# Patient Record
Sex: Male | Born: 1995 | Race: Black or African American | Hispanic: No | Marital: Single | Smoking: Never smoker
Health system: Southern US, Community
[De-identification: ages and names within clinical notes are randomized; demographics above are authoritative.]

---
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
## 2015-06-08 ENCOUNTER — Ambulatory Visit: Payer: Self-pay | Admitting: Family Medicine

## 2015-07-07 ENCOUNTER — Ambulatory Visit (INDEPENDENT_AMBULATORY_CARE_PROVIDER_SITE_OTHER): Payer: Medicaid Other | Admitting: Family Medicine

## 2015-07-07 ENCOUNTER — Encounter: Payer: Self-pay | Admitting: Family Medicine

## 2015-07-07 VITALS — BP 118/72 | HR 79 | Temp 98.2°F | Resp 18 | Ht 67.0 in | Wt 123.6 lb

## 2015-07-07 DIAGNOSIS — R6252 Short stature (child): Secondary | ICD-10-CM

## 2015-07-07 DIAGNOSIS — Z0000 Encounter for general adult medical examination without abnormal findings: Secondary | ICD-10-CM | POA: Diagnosis not present

## 2015-07-07 NOTE — Progress Notes (Signed)
Name: Steven Schwartz   MRN: [id number]    DOB: 1996/07/26   Date:07/07/2015       Progress Note  Subjective  Chief Complaint  Chief Complaint  Patient presents with  . Establish Care    HPI  Steven Schwartz is a 19 year old male with no significant past medical history who is here today to establish care for primary care services. He reports no cigarette smoking, no excessive alcohol consumption, no illicit drug use. 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 He exercises regularly by playing basketball or football 2-3 x a week at least. He wonders if he has stopped growing in height. His parents are healthy with no medical issues he is aware of.   Social History  Substance Use Topics  . Smoking status: Never Smoker   . Smokeless tobacco: Not on file  . Alcohol Use: 0.0 oz/week    0 Standard drinks or equivalent per week     Comment: occasionally    No current outpatient prescriptions on file.  History reviewed. No pertinent past surgical history.  History reviewed. No pertinent family history.  No Known Allergies   Review of Systems  CONSTITUTIONAL: No significant weight changes, fever, chills, weakness or fatigue.  HEENT:  - Eyes: No visual changes.  - Ears: No auditory changes. No pain.  - Nose: No sneezing, congestion, runny nose. - Throat: No sore throat. No changes in swallowing. SKIN: No rash or itching.  CARDIOVASCULAR: No chest pain, chest pressure or chest discomfort. No palpitations or edema.  RESPIRATORY: No shortness of breath, cough or sputum.  GASTROINTESTINAL: No anorexia, nausea, vomiting. No changes in bowel habits. No abdominal pain or blood.  GENITOURINARY: No dysuria. No frequency. No discharge.  NEUROLOGICAL: No headache, dizziness, syncope, paralysis, ataxia, numbness or tingling in the extremities. No memory changes.  No change in bowel or bladder control.  MUSCULOSKELETAL: No joint pain. No muscle pain. HEMATOLOGIC: No anemia, bleeding or bruising.  LYMPHATICS: No enlarged lymph nodes.  PSYCHIATRIC: No change in mood. No change in sleep pattern.  ENDOCRINOLOGIC: No reports of sweating, cold or heat intolerance. No polyuria or polydipsia.     Objective  BP 118/72 mmHg  Pulse 79  Temp(Src) 98.2 F (36.8 C) (Oral)  Resp 18  Ht  (1.702 m)  Wt 123 lb 9.6 oz (56.065 kg)  BMI 19.35 kg/m2  SpO2 98% Body mass index is 19.35 kg/(m^2).  Physical Exam  Constitutional: Patient appears well-developed and well-nourished. In no distress.  HEENT:  - Head: Normocephalic and atraumatic.  - Ears: Bilateral TMs gray, no erythema or effusion - Nose: Nasal mucosa moist - Mouth/Throat: Oropharynx is clear and moist. No tonsillar hypertrophy or erythema. No post nasal drainage.  - Eyes: Conjunctivae clear, EOM movements normal. PERRLA. No scleral icterus.  Neck: Normal range of motion. Neck supple. No JVD present. No thyromegaly present.  Cardiovascular: Normal rate, regular rhythm and normal heart sounds.  No murmur heard.  Pulmonary/Chest: Effort normal and breath sounds normal. No respiratory distress. Musculoskeletal: Normal range of motion bilateral UE and LE, no joint effusions. Peripheral vascular: Bilateral LE no edema. Neurological: CN II-XII grossly intact with no focal deficits. Alert and oriented to person, place, and time. Coordination, balance, strength, speech and gait are normal.  Skin: Skin is warm and dry. No rash noted.  No erythema.  Psychiatric: Patient has a normal mood and affect. Behavior is normal in office today. Judgment and thought content normal in office today.   Assessment & Plan   1. Height below average Healthy male likely reached adult height. Declined blood work for now as he has no risk factors. Continue healthy habits and discussed need to STD screening annually.

## 2017-03-23 ENCOUNTER — Emergency Department
Admission: EM | Admit: 2017-03-23 | Discharge: 2017-03-23 | Disposition: A | Discharge status: Home / Self Care | Payer: Self-pay | Attending: Student in an Organized Health Care Education/Training Program | Admitting: Student in an Organized Health Care Education/Training Program

## 2017-03-23 ENCOUNTER — Encounter: Payer: Self-pay | Admitting: Emergency Medicine

## 2017-03-23 ENCOUNTER — Emergency Department: Payer: Self-pay

## 2017-03-23 DIAGNOSIS — S86011A Strain of right Achilles tendon, initial encounter: Secondary | ICD-10-CM | POA: Insufficient documentation

## 2017-03-23 DIAGNOSIS — Y9367 Activity, basketball: Secondary | ICD-10-CM | POA: Insufficient documentation

## 2017-03-23 DIAGNOSIS — Y999 Unspecified external cause status: Secondary | ICD-10-CM | POA: Insufficient documentation

## 2017-03-23 DIAGNOSIS — X58XXXA Exposure to other specified factors, initial encounter: Secondary | ICD-10-CM | POA: Insufficient documentation

## 2017-03-23 DIAGNOSIS — Y929 Unspecified place or not applicable: Secondary | ICD-10-CM | POA: Insufficient documentation

## 2017-03-23 MED ORDER — NAPROXEN 500 MG PO TABS: 500.0000 mg | ORAL_TABLET | Freq: Two times a day (BID) | ORAL | 0 refills | Status: AC

## 2017-03-23 MED ORDER — HYDROCODONE-ACETAMINOPHEN 5-325 MG PO TABS: 1.0000 | ORAL_TABLET | ORAL | 0 refills | Status: DC | PRN

## 2017-03-23 NOTE — ED Provider Notes (Signed)
[facility] Emergency Department Provider Note ____________________________________________  Time seen: Approximately 8:46 AM  I have reviewed the triage vital signs and the nursing notes.   HISTORY  Chief Complaint Ankle Pain    HPI Steven Schwartz is a 21 y.o. male who presents to the emergency department for evaluation of right ankle pain that started after playing basketball. He now has pain to the back of the right ankle and lower leg with some swelling. He has not taken anything for pain. He denies previous injury to the ankle.  History reviewed. No pertinent past medical history.   Patient Active Problem List   Diagnosis Date Noted  . Height below average 07/07/2015    History reviewed. No pertinent surgical history.  Prior to Admission medications   Medication Sig Start Date End Date Taking? Authorizing Provider  HYDROcodone-acetaminophen (NORCO/VICODIN) 5-325 MG tablet Take 1 tablet by mouth every 4 (four) hours as needed for moderate pain. 03/23/17 03/23/18  Ashland Osmer, Rulon Eisenmengerari B, FNP  naproxen (NAPROSYN) 500 MG tablet Take 1 tablet (500 mg total) by mouth 2 (two) times daily with a meal. 03/23/17   Norita Meigs B, FNP    Allergies Patient has no known allergies.  No family history on file.  Social History Social History  Substance Use Topics  . Smoking status: Never Smoker  . Smokeless tobacco: Never Used  . Alcohol use 0.0 oz/week     Comment: occasionally    Review of Systems Constitutional: Well appearing. Cardiovascular: Negative for active bleeding  Respiratory: Negative for shortness of breath Musculoskeletal: Positive for right foot and ankle pain. Skin: Negative for lesion or wound. Neurological: Negative for loss of sensation or motor function  ____________________________________________   PHYSICAL EXAM:  VITAL SIGNS: ED Triage Vitals [03/23/17 0837]  Enc Vitals Group     BP 117/84     Pulse Rate 77     Resp 20   Temp 98.3 F (36.8 C)     Temp Source Oral     SpO2 100 %     Weight 123 lb (55.8 kg)     Height      Head Circumference      Peak Flow      Pain Score 10     Pain Loc      Pain Edu?      Excl. in GC?     Constitutional: Alert and oriented. Well appearing and in no acute distress. Eyes: Conjunctiva are clear without drainage  Head: Atraumatic Neck: Full ROM, Nexus criteria is negative Respiratory: Respirations are even and unlabored. Musculoskeletal: Limited ROM of the right ankle due to pain. Thompson's test is positive.  Neurologic: +2 point discrimination intact.  Skin: No lesion or wound  Psychiatric: Normal affect and behavior.  ____________________________________________   LABS (all labs ordered are listed, but only abnormal results are displayed)  Labs Reviewed - No data to display ____________________________________________  RADIOLOGY  Right ankle negative for acute bony abnormality. Thickening in the region of the Achilles which may be related to a partial tear.  ____________________________________________   PROCEDURES  Procedure(s) performed: None  ____________________________________________   INITIAL IMPRESSION / ASSESSMENT AND PLAN / ED COURSE  Steven Schwartz is a 21 y.o. male who presents to the emergency department for evaluation of right ankle and foot pain. Pain started after jumping during basketball game. X-ray and exam consistent with Achilles Tendon rupture. He was placed in an ankle stirrup splint and advised to leave it in  place until follow up with orthopedics. He was advised to return to the ER for symptoms that change or worsen if unable to schedule an appointment.  Pertinent labs & imaging results that were available during my care of the patient were reviewed by me and considered in my medical decision making (see chart for details).  _________________________________________   FINAL CLINICAL IMPRESSION(S) / ED DIAGNOSES  Final  diagnoses:  Achilles tendon tear, right, initial encounter    Discharge Medication List as of 03/23/2017 11:10 AM    START taking these medications   Details  HYDROcodone-acetaminophen (NORCO/VICODIN) 5-325 MG tablet Take 1 tablet by mouth every 4 (four) hours as needed for moderate pain., Starting Thu 03/23/2017, Until Fri 03/23/2018, Print    naproxen (NAPROSYN) 500 MG tablet Take 1 tablet (500 mg total) by mouth 2 (two) times daily with a meal., Starting Thu 03/23/2017, Print        If controlled substance prescribed during this visit, 12 month history viewed on the [facility] prior to issuing an initial prescription for Schedule II or III opiod.    Chinita Pester, FNP 03/27/17 1505    Willy Eddy, MD 03/27/17 [phone]

## 2017-03-23 NOTE — ED Notes (Signed)
Pt reports hurting ankle playing basketball yesterday. Reports pain to back of right ankle and lower leg. Minimal swelling noted. Good pedal pulses.

## 2017-03-23 NOTE — Discharge Instructions (Signed)
Wear the ankle stirrup splint until you are evaluated by orthopedics. Use your crutches.

## 2017-03-23 NOTE — ED Triage Notes (Signed)
Pt with right ankle pain.  

## 2017-03-30 ENCOUNTER — Encounter: Payer: Self-pay | Admitting: *Deleted

## 2017-03-30 ENCOUNTER — Encounter
Admission: RE | Disposition: A | Discharge status: Home / Self Care | Payer: Self-pay | Source: Ambulatory Visit | Attending: Surgery

## 2017-03-30 ENCOUNTER — Ambulatory Visit: Payer: Self-pay | Admitting: Anesthesiology

## 2017-03-30 ENCOUNTER — Ambulatory Visit
Admission: RE | Admit: 2017-03-30 | Discharge: 2017-03-30 | Disposition: A | Discharge status: Home / Self Care | Payer: Self-pay | Source: Ambulatory Visit | Attending: Surgery | Admitting: Surgery

## 2017-03-30 DIAGNOSIS — S86012A Strain of left Achilles tendon, initial encounter: Secondary | ICD-10-CM | POA: Insufficient documentation

## 2017-03-30 DIAGNOSIS — Z8249 Family history of ischemic heart disease and other diseases of the circulatory system: Secondary | ICD-10-CM | POA: Insufficient documentation

## 2017-03-30 DIAGNOSIS — Y929 Unspecified place or not applicable: Secondary | ICD-10-CM | POA: Insufficient documentation

## 2017-03-30 DIAGNOSIS — Y9367 Activity, basketball: Secondary | ICD-10-CM | POA: Insufficient documentation

## 2017-03-30 HISTORY — PX: ACHILLES TENDON SURGERY: SHX542

## 2017-03-30 LAB — URINE DRUG SCREEN, QUALITATIVE (ARMC ONLY)
AMPHETAMINES, UR SCREEN: NOT DETECTED
BENZODIAZEPINE, UR SCRN: NOT DETECTED
Barbiturates, Ur Screen: NOT DETECTED
COCAINE METABOLITE, UR ~~LOC~~: NOT DETECTED
Cannabinoid 50 Ng, Ur ~~LOC~~: POSITIVE — AB
MDMA (Ecstasy)Ur Screen: NOT DETECTED
METHADONE SCREEN, URINE: NOT DETECTED
OPIATE, UR SCREEN: POSITIVE — AB
Phencyclidine (PCP) Ur S: NOT DETECTED
Tricyclic, Ur Screen: NOT DETECTED

## 2017-03-30 SURGERY — REPAIR, TENDON, ACHILLES
Anesthesia: General | Laterality: Right | Wound class: Clean

## 2017-03-30 MED ORDER — SUGAMMADEX SODIUM 200 MG/2ML IV SOLN
INTRAVENOUS | Status: DC | PRN
  Administered 2017-03-30: 200 mg via INTRAVENOUS

## 2017-03-30 MED ORDER — MIDAZOLAM HCL 2 MG/2ML IJ SOLN
INTRAMUSCULAR | Status: AC
  Filled 2017-03-30: qty 2

## 2017-03-30 MED ORDER — FENTANYL CITRATE (PF) 100 MCG/2ML IJ SOLN
25.0000 ug | INTRAMUSCULAR | Status: DC | PRN
Start: 2017-03-30 — End: 2017-03-30

## 2017-03-30 MED ORDER — ONDANSETRON HCL 4 MG/2ML IJ SOLN: 4.0000 mg | Freq: Once | INTRAMUSCULAR | Status: DC | PRN

## 2017-03-30 MED ORDER — MIDAZOLAM HCL 2 MG/2ML IJ SOLN
2.0000 mg | Freq: Once | INTRAMUSCULAR | Status: AC
  Administered 2017-03-30: 2 mg via INTRAVENOUS

## 2017-03-30 MED ORDER — FENTANYL CITRATE (PF) 100 MCG/2ML IJ SOLN
50.0000 ug | Freq: Once | INTRAMUSCULAR | Status: AC
  Administered 2017-03-30: 50 ug via INTRAVENOUS

## 2017-03-30 MED ORDER — LIDOCAINE HCL (PF) 1 % IJ SOLN
INTRAMUSCULAR | Status: DC | PRN
  Administered 2017-03-30: 3 mL

## 2017-03-30 MED ORDER — ONDANSETRON HCL 4 MG/2ML IJ SOLN
INTRAMUSCULAR | Status: AC
  Filled 2017-03-30: qty 2

## 2017-03-30 MED ORDER — PROPOFOL 10 MG/ML IV BOLUS
INTRAVENOUS | Status: DC | PRN
  Administered 2017-03-30: 20 mg via INTRAVENOUS
  Administered 2017-03-30: 120 mg via INTRAVENOUS
  Administered 2017-03-30: 50 mg via INTRAVENOUS

## 2017-03-30 MED ORDER — ONDANSETRON HCL 4 MG/2ML IJ SOLN
INTRAMUSCULAR | Status: DC | PRN
  Administered 2017-03-30: 4 mg via INTRAVENOUS

## 2017-03-30 MED ORDER — BUPIVACAINE HCL (PF) 0.5 % IJ SOLN
INTRAMUSCULAR | Status: AC
  Filled 2017-03-30: qty 30

## 2017-03-30 MED ORDER — FENTANYL CITRATE (PF) 100 MCG/2ML IJ SOLN
INTRAMUSCULAR | Status: AC
  Filled 2017-03-30: qty 2

## 2017-03-30 MED ORDER — ACETAMINOPHEN 10 MG/ML IV SOLN
INTRAVENOUS | Status: DC | PRN
  Administered 2017-03-30: 1000 mg via INTRAVENOUS

## 2017-03-30 MED ORDER — ONDANSETRON HCL 4 MG PO TABS: 4.0000 mg | ORAL_TABLET | Freq: Four times a day (QID) | ORAL | Status: DC | PRN

## 2017-03-30 MED ORDER — ROPIVACAINE HCL 5 MG/ML IJ SOLN
INTRAMUSCULAR | Status: AC
  Filled 2017-03-30: qty 30

## 2017-03-30 MED ORDER — ROCURONIUM BROMIDE 100 MG/10ML IV SOLN
INTRAVENOUS | Status: DC | PRN
  Administered 2017-03-30: 50 mg via INTRAVENOUS

## 2017-03-30 MED ORDER — PROPOFOL 10 MG/ML IV BOLUS
INTRAVENOUS | Status: AC
  Filled 2017-03-30: qty 20

## 2017-03-30 MED ORDER — SUGAMMADEX SODIUM 200 MG/2ML IV SOLN
INTRAVENOUS | Status: AC
  Filled 2017-03-30: qty 2

## 2017-03-30 MED ORDER — PHENYLEPHRINE HCL 10 MG/ML IJ SOLN
INTRAMUSCULAR | Status: AC
  Filled 2017-03-30: qty 1

## 2017-03-30 MED ORDER — NEOMYCIN-POLYMYXIN B GU 40-200000 IR SOLN
Status: AC
  Filled 2017-03-30: qty 4

## 2017-03-30 MED ORDER — ACETAMINOPHEN 10 MG/ML IV SOLN
INTRAVENOUS | Status: AC
  Filled 2017-03-30: qty 100

## 2017-03-30 MED ORDER — BUPIVACAINE HCL 0.5 % IJ SOLN
INTRAMUSCULAR | Status: DC | PRN
  Administered 2017-03-30: 20 mL

## 2017-03-30 MED ORDER — NEOMYCIN-POLYMYXIN B GU 40-200000 IR SOLN
Status: AC
  Filled 2017-03-30: qty 2

## 2017-03-30 MED ORDER — LIDOCAINE HCL (PF) 1 % IJ SOLN
INTRAMUSCULAR | Status: AC
  Filled 2017-03-30: qty 5

## 2017-03-30 MED ORDER — PHENYLEPHRINE HCL 10 MG/ML IJ SOLN
INTRAMUSCULAR | Status: DC | PRN
  Administered 2017-03-30 (×5): 100 ug via INTRAVENOUS

## 2017-03-30 MED ORDER — NEOMYCIN-POLYMYXIN B GU 40-200000 IR SOLN
Status: DC | PRN
  Administered 2017-03-30: 2 mL

## 2017-03-30 MED ORDER — OXYCODONE HCL 5 MG PO TABS: 5.0000 mg | ORAL_TABLET | ORAL | Status: DC | PRN

## 2017-03-30 MED ORDER — METOCLOPRAMIDE HCL 5 MG/ML IJ SOLN: 5.0000 mg | Freq: Three times a day (TID) | INTRAMUSCULAR | Status: DC | PRN

## 2017-03-30 MED ORDER — LACTATED RINGERS IV SOLN
INTRAVENOUS | Status: DC
  Administered 2017-03-30: 12:00:00 via INTRAVENOUS

## 2017-03-30 MED ORDER — POTASSIUM CHLORIDE IN NACL 20-0.9 MEQ/L-% IV SOLN: INTRAVENOUS | Status: DC

## 2017-03-30 MED ORDER — METOCLOPRAMIDE HCL 10 MG PO TABS: 5.0000 mg | ORAL_TABLET | Freq: Three times a day (TID) | ORAL | Status: DC | PRN

## 2017-03-30 MED ORDER — CEFAZOLIN SODIUM-DEXTROSE 2-4 GM/100ML-% IV SOLN
2.0000 g | Freq: Once | INTRAVENOUS | Status: AC
  Administered 2017-03-30: 2 g via INTRAVENOUS

## 2017-03-30 MED ORDER — ONDANSETRON HCL 4 MG/2ML IJ SOLN: 4.0000 mg | Freq: Four times a day (QID) | INTRAMUSCULAR | Status: DC | PRN

## 2017-03-30 MED ORDER — LACTATED RINGERS IV SOLN
INTRAVENOUS | Status: DC | PRN
  Administered 2017-03-30: 13:00:00 via INTRAVENOUS

## 2017-03-30 MED ORDER — ROCURONIUM BROMIDE 50 MG/5ML IV SOLN
INTRAVENOUS | Status: AC
  Filled 2017-03-30: qty 1

## 2017-03-30 MED ORDER — CEFAZOLIN SODIUM-DEXTROSE 2-4 GM/100ML-% IV SOLN
INTRAVENOUS | Status: AC
  Filled 2017-03-30: qty 100

## 2017-03-30 MED ORDER — LIDOCAINE HCL (CARDIAC) 20 MG/ML IV SOLN
INTRAVENOUS | Status: DC | PRN
  Administered 2017-03-30: 40 mg via INTRAVENOUS

## 2017-03-30 MED ORDER — ROPIVACAINE HCL 5 MG/ML IJ SOLN
INTRAMUSCULAR | Status: DC | PRN
  Administered 2017-03-30: 30 mL via PERINEURAL

## 2017-03-30 MED ORDER — FENTANYL CITRATE (PF) 100 MCG/2ML IJ SOLN
INTRAMUSCULAR | Status: DC | PRN
  Administered 2017-03-30: 50 ug via INTRAVENOUS

## 2017-03-30 MED ORDER — OXYCODONE HCL 5 MG PO TABS: 5.0000 mg | ORAL_TABLET | ORAL | 0 refills | Status: AC | PRN

## 2017-03-30 SURGICAL SUPPLY — 41 items
BANDAGE ACE 4X5 VEL STRL LF (GAUZE/BANDAGES/DRESSINGS) ×6 IMPLANT
BANDAGE ACE 6X5 VEL STRL LF (GAUZE/BANDAGES/DRESSINGS) ×3 IMPLANT
BNDG ESMARK 6X12 TAN STRL LF (GAUZE/BANDAGES/DRESSINGS) ×3 IMPLANT
BNDG PLASTER FAST 4X5 WHT LF (CAST SUPPLIES) ×12 IMPLANT
CANISTER SUCT 1200ML W/VALVE (MISCELLANEOUS) ×3 IMPLANT
CASTING MATERIAL DELTA LITE (CAST SUPPLIES) ×3 IMPLANT
CHLORAPREP W/TINT 26ML (MISCELLANEOUS) ×6 IMPLANT
CUFF TOURN 24 STER (MISCELLANEOUS) ×3 IMPLANT
DRAPE INCISE IOBAN 66X45 STRL (DRAPES) ×3 IMPLANT
DRAPE U-SHAPE 47X51 STRL (DRAPES) ×3 IMPLANT
ELECT CAUTERY BLADE 6.4 (BLADE) ×3 IMPLANT
ELECT REM PT RETURN 9FT ADLT (ELECTROSURGICAL) ×3
ELECTRODE REM PT RTRN 9FT ADLT (ELECTROSURGICAL) ×1 IMPLANT
GAUZE PETRO XEROFOAM 1X8 (MISCELLANEOUS) ×3 IMPLANT
GAUZE SPONGE 4X4 12PLY STRL (GAUZE/BANDAGES/DRESSINGS) ×3 IMPLANT
GLOVE BIO SURGEON STRL SZ8 (GLOVE) ×6 IMPLANT
GLOVE INDICATOR 8.0 STRL GRN (GLOVE) ×3 IMPLANT
GOWN STRL REUS W/ TWL LRG LVL3 (GOWN DISPOSABLE) ×1 IMPLANT
GOWN STRL REUS W/ TWL XL LVL3 (GOWN DISPOSABLE) ×1 IMPLANT
GOWN STRL REUS W/TWL LRG LVL3 (GOWN DISPOSABLE) ×2
GOWN STRL REUS W/TWL XL LVL3 (GOWN DISPOSABLE) ×2
KIT RM TURNOVER STRD PROC AR (KITS) ×3 IMPLANT
NS IRRIG 1000ML POUR BTL (IV SOLUTION) ×3 IMPLANT
PAD CAST CTTN 4X4 STRL (SOFTGOODS) ×3 IMPLANT
PADDING CAST COTTON 4X4 STRL (SOFTGOODS) ×6
SPONGE LAP 18X18 5 PK (GAUZE/BANDAGES/DRESSINGS) ×3 IMPLANT
SPONGE XRAY 4X4 16PLY STRL (MISCELLANEOUS) ×3 IMPLANT
STOCKINETTE M/LG 89821 (MISCELLANEOUS) ×3 IMPLANT
STRAP SAFETY BODY (MISCELLANEOUS) ×3 IMPLANT
SUT ETHIBOND 5-0 MS/4 CCS GRN (SUTURE) ×3
SUT FIBERWIRE #2 38 BLUE 1/2 (SUTURE) ×6
SUT FIBERWIRE #5 38 CONV BLUE (SUTURE)
SUT PROLENE 4 0 PS 2 18 (SUTURE) ×15 IMPLANT
SUT TICRON 2-0 30IN 311381 (SUTURE) ×3 IMPLANT
SUT VIC AB 0 CT1 36 (SUTURE) ×3 IMPLANT
SUT VIC AB 1 CT1 36 (SUTURE) ×3 IMPLANT
SUT VIC AB 2-0 SH 27 (SUTURE) ×4
SUT VIC AB 2-0 SH 27XBRD (SUTURE) ×2 IMPLANT
SUTURE ETHBND 5-0 MS/4 CCS GRN (SUTURE) ×1 IMPLANT
SUTURE FIBERWR #2 38 BLUE 1/2 (SUTURE) ×2 IMPLANT
SUTURE FIBERWR #5 38 CONV BLUE (SUTURE) IMPLANT

## 2017-03-30 NOTE — Anesthesia Procedure Notes (Signed)
Performed by: Randalyn Ahmed       

## 2017-03-30 NOTE — Anesthesia Preprocedure Evaluation (Signed)
Anesthesia Evaluation  Patient identified by MRN, date of birth, ID band Patient awake    Reviewed: Allergy & Precautions, NPO status , Patient's Chart, lab work & pertinent test results  History of Anesthesia Complications Negative for: history of anesthetic complications  Airway Mallampati: I  TM Distance: >3 FB Neck ROM: Full    Dental no notable dental hx.    Pulmonary neg pulmonary ROS, neg sleep apnea, neg COPD,    breath sounds clear to auscultation- rhonchi (-) wheezing      Cardiovascular Exercise Tolerance: Good (-) hypertension(-) CAD and (-) Past MI  Rhythm:Regular Rate:Normal - Systolic murmurs and - Diastolic murmurs    Neuro/Psych negative neurological ROS  negative psych ROS   GI/Hepatic negative GI ROS, Neg liver ROS,   Endo/Other  negative endocrine ROSneg diabetes  Renal/GU negative Renal ROS     Musculoskeletal negative musculoskeletal ROS (+)   Abdominal (+) - obese,   Peds  Hematology negative hematology ROS (+)   Anesthesia Other Findings   Reproductive/Obstetrics                             Anesthesia Physical Anesthesia Plan  ASA: I  Anesthesia Plan: General   Post-op Pain Management:  Regional for Post-op pain   Induction: Intravenous  Airway Management Planned: Oral ETT  Additional Equipment:   Intra-op Plan:   Post-operative Plan: Extubation in OR  Informed Consent: I have reviewed the patients History and Physical, chart, labs and discussed the procedure including the risks, benefits and alternatives for the proposed anesthesia with the patient or authorized representative who has indicated his/her understanding and acceptance.   Dental advisory given  Plan Discussed with: CRNA and Anesthesiologist  Anesthesia Plan Comments:         Anesthesia Quick Evaluation

## 2017-03-30 NOTE — Anesthesia Procedure Notes (Signed)
Anesthesia Regional Block: Popliteal block   Pre-Anesthetic Checklist: ,, timeout performed, Correct Patient, Correct Site, Correct Laterality, Correct Procedure, Correct Position, site marked, Risks and benefits discussed,  Surgical consent,  Pre-op evaluation,  At surgeon's request and post-op pain management  Laterality: Right  Prep: chloraprep, alcohol swabs       Needles:  Injection technique: Single-shot  Needle Type: Echogenic Needle     Needle Length: 10cm  Needle Gauge: 20     Additional Needles:   Procedures: ultrasound guided,,,,,,,,  Narrative:  Start time: 03/30/2017 1:35 PM End time: 03/30/2017 1:50 PM Injection made incrementally with aspirations every 5 mL.  Performed by: Personally  Anesthesiologist: Brinden Kincheloe  Additional Notes: Functioning IV was confirmed and monitors were applied.  A Stimuplex needle was used. Sterile prep and drape,hand hygiene and sterile gloves were used.  Negative aspiration and negative test dose prior to incremental administration of local anesthetic. The patient tolerated the procedure well.

## 2017-03-30 NOTE — Transfer of Care (Signed)
Immediate Anesthesia Transfer of Care Note  Patient: Steven Schwartz  Procedure(s) Performed: Procedure(s): ACHILLES TENDON REPAIR (Right)  Patient Location: PACU  Anesthesia Type:General  Level of Consciousness: sedated  Airway & Oxygen Therapy: Patient Spontanous Breathing and Patient connected to face mask oxygen  Post-op Assessment: Report given to RN and Post -op Vital signs reviewed and stable  Post vital signs: Reviewed and stable  Last Vitals:  Vitals:   03/30/17 1354 03/30/17 1533  BP: 119/72 (!) 110/59  Pulse: 78 71  Resp: 16 20  Temp:  36.2 C    Last Pain:  Vitals:   03/30/17 1059  TempSrc: Oral  PainSc: 7       Patients Stated Pain Goal: 2 (03/30/17 1059)  Complications: No apparent anesthesia complications

## 2017-03-30 NOTE — Op Note (Signed)
03/30/2017  3:31 PM  Patient:   Steven Schwartz  Pre-Op Diagnosis:   Acute right Achilles tendon rupture.  Post-Op Diagnosis:   Same.  Procedure:   Primary repair of acute right Achilles tendon rupture.  Surgeon:   Maryagnes AmosJ. Jeffrey Timmothy Baranowski, MD  Assistant:   None  Anesthesia:   GET  Findings:   As above.  Complications:   None  EBL:   10 cc  Fluids:   1000 cc crystalloid  TT:   45 min. at 300 mmHg  Drains:   None  Closure:   4-0 Prolene interrupted sutures  Brief Clinical Note:   The patient is a 21 year old male who sustained the above-noted injury last week while playing basketball. He presented to the orthopedic clinic where examination confirmed the above-noted findings. The patient presents at this time for a primary repair of the right Achilles tendon rupture.  Procedure:   The patient was brought into the operating room and lain in the supine position. After adequate general endotracheal intubation and anesthesia were obtained, the patient was rolled into the prone position on the operating table. Care was taken to be sure the chest pads were well positioned and that his knees were well padded. The right lower extremity and foot was prepped with ChloraPrep solution before being draped sterilely. Preoperative antibiotics were administered. A timeout was performed to verify the appropriate surgical site before an approximately 6-7 cm incision was made along the postero-medial aspect of the Achilles tendon centered over the palpated defect. The incision was carried down through the subcutaneous tissues to expose the tendon sheath. This was incised the length of the incision to expose the tendon rupture site. The frayed margins of the proximal and distal ends of the ruptured tendon were debrided sharply. A #2 FiberWire was woven through the proximal tendon stump in a Bunnell type fashion. A second #2 FiberWire was woven through the distal tendon stump similarly. A strong pull was applied to  each suture to be sure that they were well seated and secure. With the ankle plantar flexed to approximately 20, the sutures were tied together to reapproximate the Achilles tendon. A 4-0 Prolene suture was placed in a baseball stitch type fashion across the medial, posterior, and lateral portions of the repaired tendon to reapproximate the margins of the rupture site. The tendon repair appeared stable to 10 of plantar flexion.  The wound was copiously irrigated with sterile saline solution before the tendon sheath was carefully reapproximated using 2-0 Vicryl interrupted sutures. The subcutaneous tissues were closed using 2-0 Vicryl interrupted sutures before the skin was closed using 4-0 Prolene interrupted sutures. A total of 20 cc of 0.5% plain Sensorcaine was injected in and around the incision site to help with postoperative analgesia before a sterile bulky dressing was applied to the wound. The patient was then placed into a posterior splint with a sugar tong subsequent, maintaining the ankle at approximately 10 of plantar flexion. The patient was rolled back into the supine position on his hospital bed before he was awakened, extubated, and returned to the recovery room in satisfactory condition after tolerating the procedure well.

## 2017-03-30 NOTE — Discharge Instructions (Addendum)
Keep splint dry and intact.  Apply ice frequently to ankle. Take naproxen 500 mg BID or ibuprofen 600 mg TID with meals for 7-10 days, then as necessary. Take oxycodone as prescribed when needed.  May supplement with ES Tylenol if necessary. No weight-bearing on right leg - use crutches. Follow-up in 10-14 days or as scheduled.    AMBULATORY SURGERY  DISCHARGE INSTRUCTIONS   1) The drugs that you were given will stay in your system until tomorrow so for the next 24 hours you should not:  A) Drive an automobile B) Make any legal decisions C) Drink any alcoholic beverage   2) You may resume regular meals tomorrow.  Today it is better to start with liquids and gradually work up to solid foods.  You may eat anything you prefer, but it is better to start with liquids, then soup and crackers, and gradually work up to solid foods.   3) Please notify your doctor immediately if you have any unusual bleeding, trouble breathing, redness and pain at the surgery site, drainage, fever, or pain not relieved by medication.    4) Additional Instructions:        Please contact your physician with any problems or Same Day Surgery at [phone], Monday through Friday 6 am to 4 pm, or [place] at [facility] Main number at [phone].

## 2017-03-30 NOTE — H&P (Signed)
Paper H&P to be scanned into permanent record. H&P reviewed and patient re-examined. No changes. 

## 2017-03-30 NOTE — Anesthesia Procedure Notes (Signed)
Procedure Name: Intubation Date/Time: 03/30/2017 2:04 PM Performed by: Allean Found Pre-anesthesia Checklist: Patient identified, Patient being monitored, Timeout performed, Emergency Drugs available and Suction available Patient Re-evaluated:Patient Re-evaluated prior to inductionOxygen Delivery Method: Circle system utilized Preoxygenation: Pre-oxygenation with 100% oxygen Intubation Type: IV induction Ventilation: Mask ventilation without difficulty Laryngoscope Size: Mac and 3 Grade View: Grade I Tube type: Oral Tube size: 7.5 mm Number of attempts: 1 Airway Equipment and Method: Stylet Placement Confirmation: ETT inserted through vocal cords under direct vision,  positive ETCO2 and breath sounds checked- equal and bilateral Secured at: 21 cm Tube secured with: Tape Dental Injury: Teeth and Oropharynx as per pre-operative assessment

## 2017-03-30 NOTE — Anesthesia Post-op Follow-up Note (Cosign Needed)
Anesthesia QCDR form completed.        

## 2017-03-30 NOTE — Anesthesia Postprocedure Evaluation (Signed)
Anesthesia Post Note  Patient: Steven Schwartz  Procedure(Schwartz) Performed: Procedure(Schwartz) (LRB): ACHILLES TENDON REPAIR (Right)  Patient location during evaluation: PACU Anesthesia Type: General Level of consciousness: awake and alert Pain management: pain level controlled Vital Signs Assessment: post-procedure vital signs reviewed and stable Respiratory status: spontaneous breathing, nonlabored ventilation, respiratory function stable and patient connected to nasal cannula oxygen Cardiovascular status: blood pressure returned to baseline and stable Postop Assessment: no signs of nausea or vomiting Anesthetic complications: no     Last Vitals:  Vitals:   03/30/17 1623 03/30/17 1658  BP: 122/87 (!) 123/95  Pulse: (!) 59 (!) 57  Resp: 16 16  Temp: 36.6 C     Last Pain:  Vitals:   03/30/17 1623  TempSrc: Temporal  PainSc:                  Steven Schwartz

## 2018-01-24 IMAGING — DX DG ANKLE COMPLETE 3+V*R*
3 series · 3 of 3 positions shown · non-contrast
Comparison: None.

CLINICAL DATA: Basketball injury yesterday with persistent ankle
pain, initial encounter

EXAM:
RIGHT ANKLE - COMPLETE 3+ VIEW

[ankle ap]
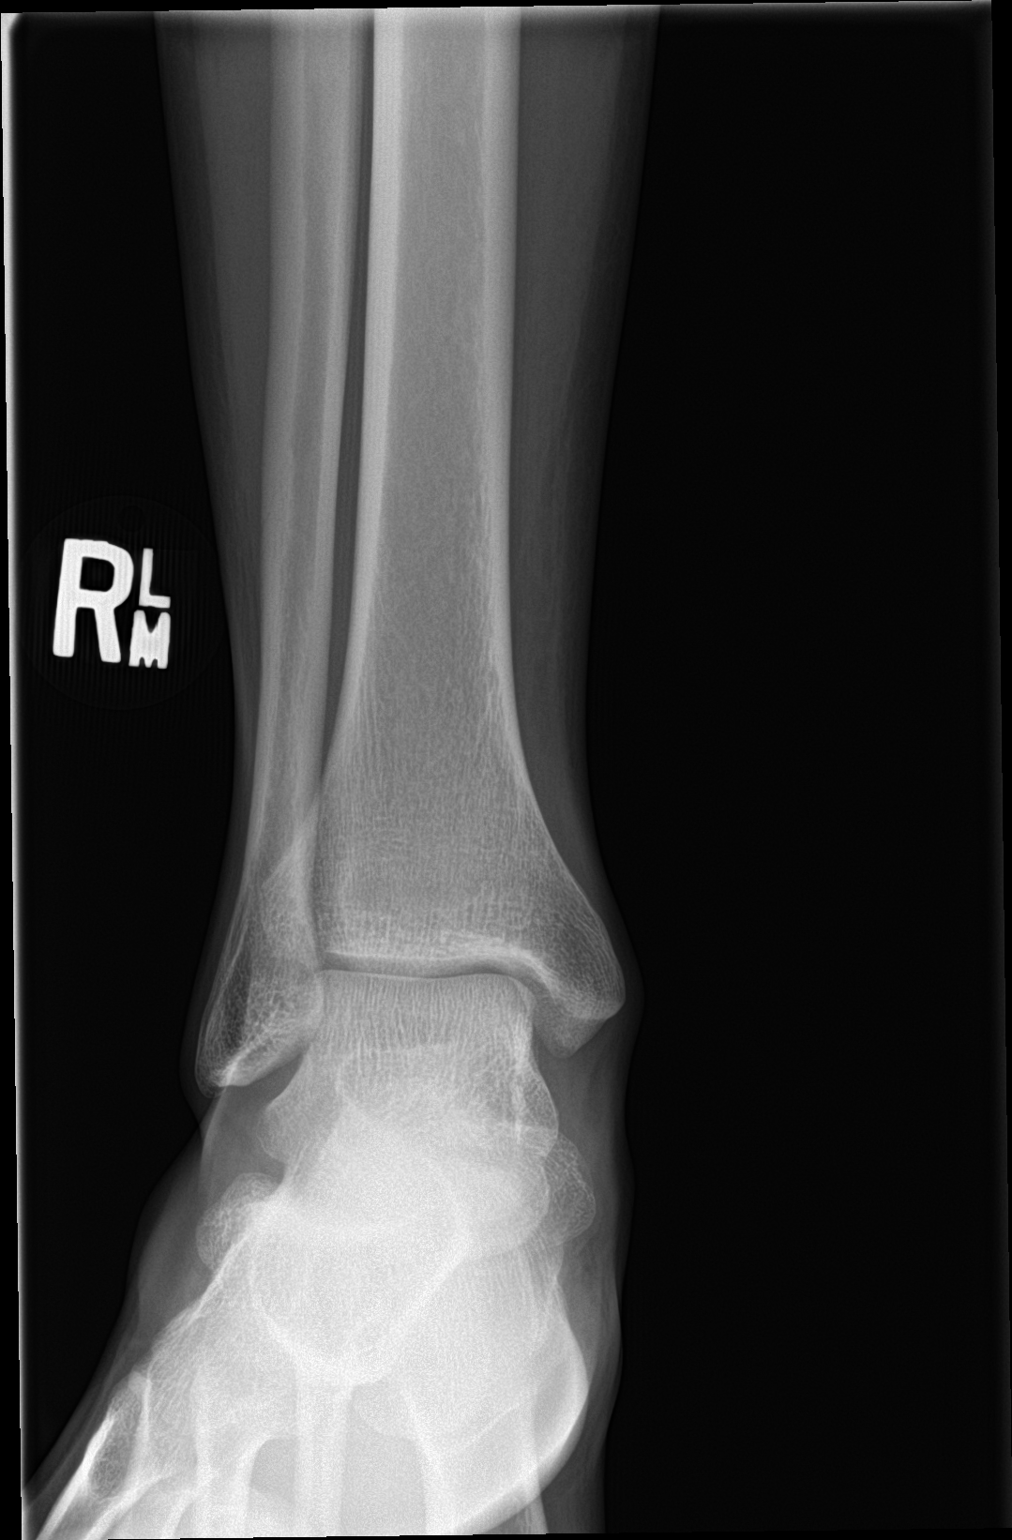

[ankle obl]
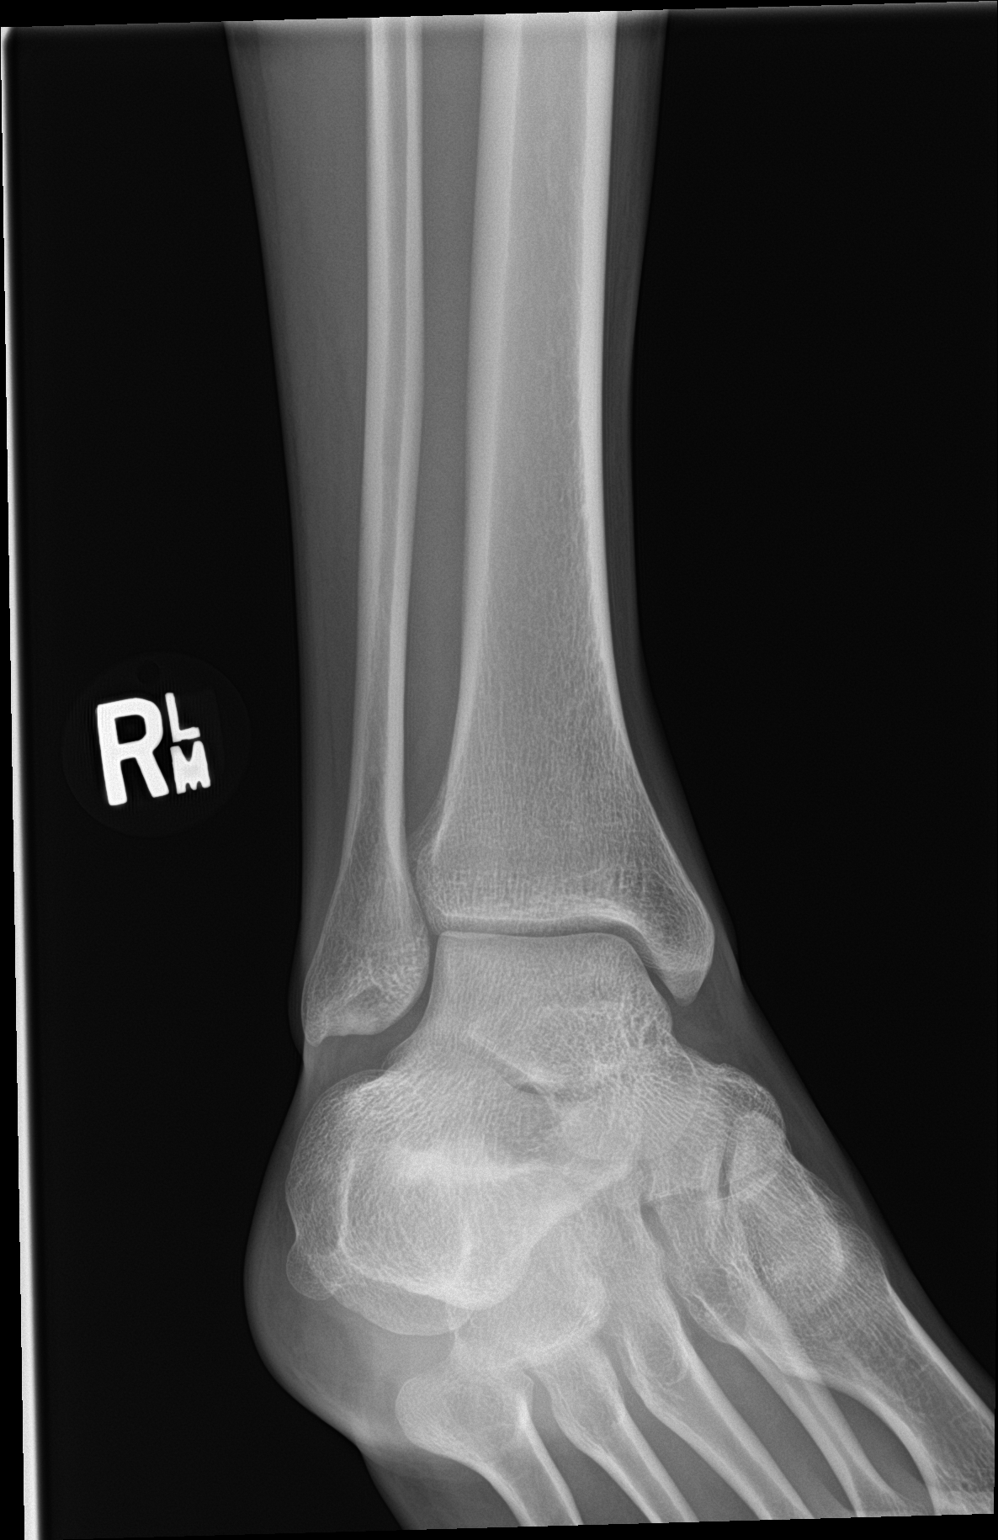

[ankle lat]
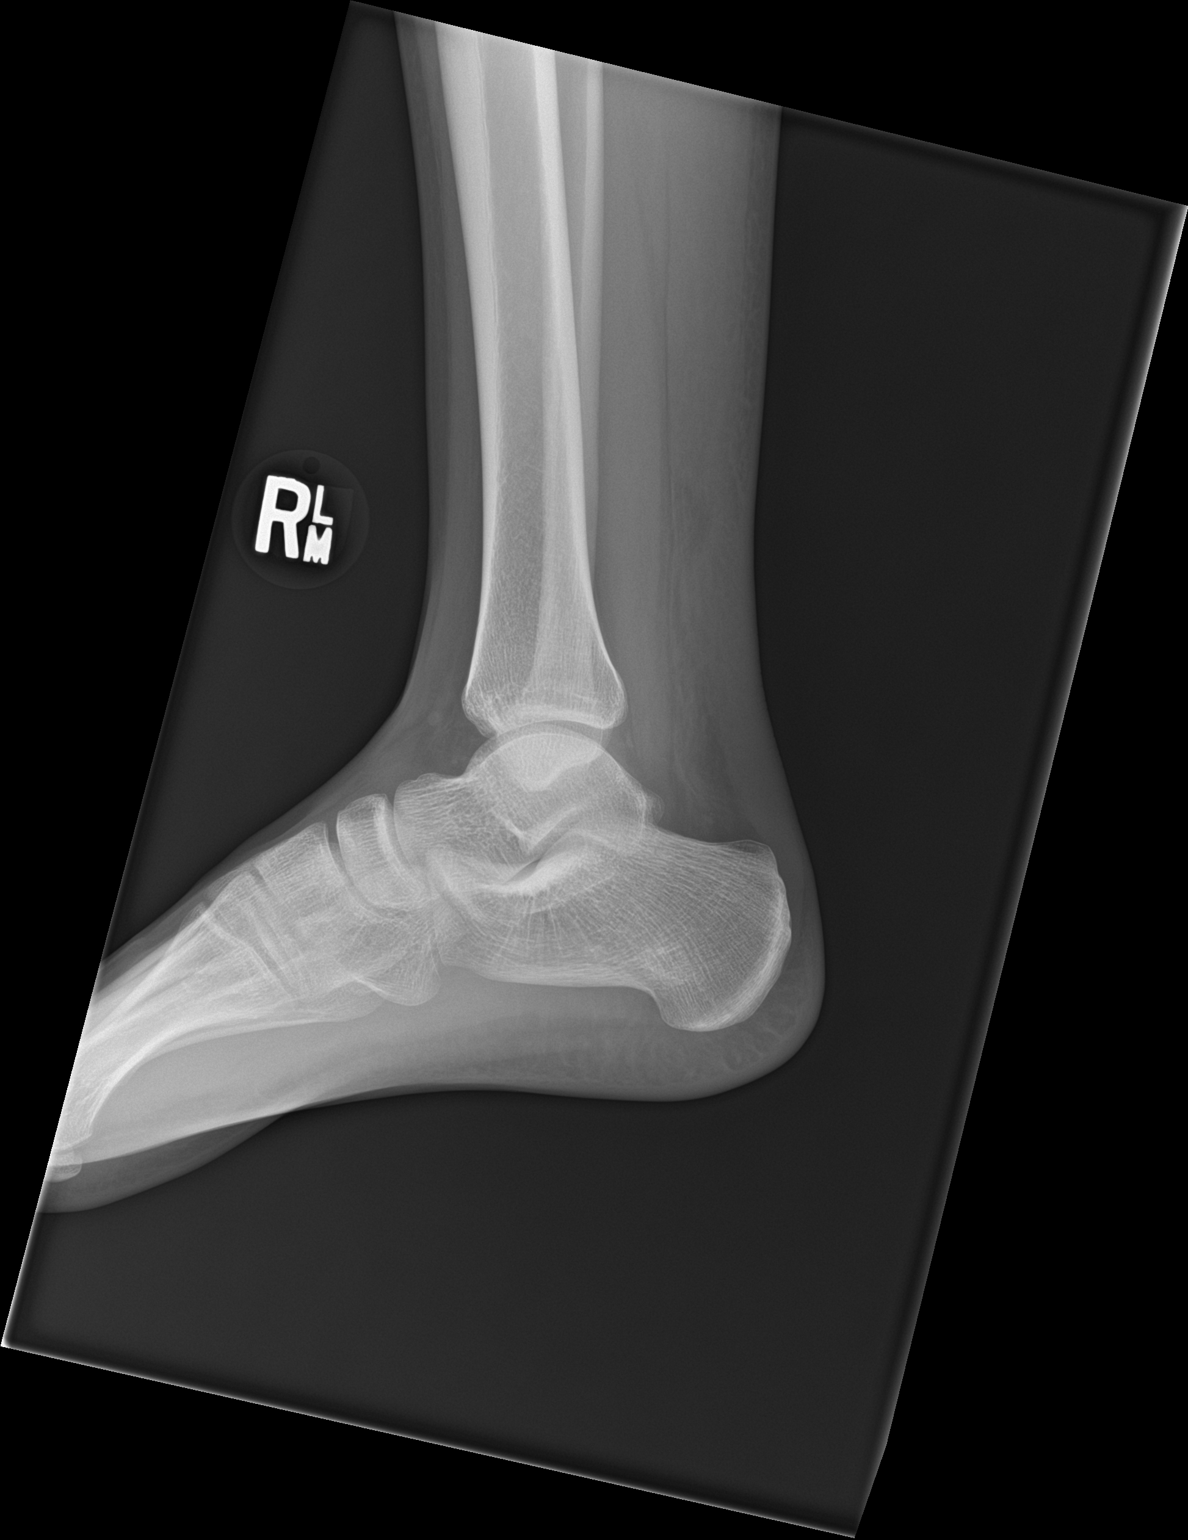

[3 of 3 positions shown; findings below may reference images not displayed]

FINDINGS: No acute fracture or dislocation is noted. There is thickening in
the region of the Achilles tendon which may indicate a the Achilles
injury. No other soft tissue abnormality is noted.
IMPRESSION: No fracture is noted.

Thickening in the region of the Achilles which may be related to a
partial tear. MRI may be helpful if clinically indicated.

## 2022-05-27 ENCOUNTER — Ambulatory Visit: Payer: Self-pay | Admitting: Nurse Practitioner

## 2022-05-27 ENCOUNTER — Encounter: Payer: Self-pay | Admitting: Nurse Practitioner

## 2022-05-27 DIAGNOSIS — Z113 Encounter for screening for infections with a predominantly sexual mode of transmission: Secondary | ICD-10-CM

## 2022-05-27 NOTE — Progress Notes (Signed)
[facility] Department STI clinic/screening visit  Subjective:  Steven Schwartz is a 26 y.o. male being seen today for an STI screening visit. The patient reports they do not have symptoms.    Patient has the following medical conditions:   Patient Active Problem List   Diagnosis Date Noted   Height below average 07/07/2015     Chief Complaint  Patient presents with   SEXUALLY TRANSMITTED DISEASE    Screening    HPI  Patient reports to clinic today for STD screening.  Patient denies any signs and symptoms.    Does the patient or their partner desires a pregnancy in the next year? No  Screening for MPX risk: Does the patient have an unexplained rash? No Is the patient MSM? No Does the patient endorse multiple sex partners or anonymous sex partners? No Did the patient have close or sexual contact with a person diagnosed with MPX? No Has the patient traveled outside the [place] where MPX is endemic? No Is there a high clinical suspicion for MPX-- evidenced by one of the following No  -Unlikely to be chickenpox  -Lymphadenopathy  -Rash that present in same phase of evolution on any given body part   See flowsheet for further details and programmatic requirements.   Immunization History  Administered Date(s) Administered   Hepatitis B May 04, 1996, 12/28/1994, 03/29/1995   HiB (PRP-OMP) 12/28/1994, 01/26/1995, 03/29/1995, 10/31/1995   MMR 10/31/1995, 05/10/2000   Tdap 04/25/2006     The following portions of the patient's history were reviewed and updated as appropriate: allergies, current medications, past medical history, past social history, past surgical history and problem list.  Objective:  There were no vitals filed for this visit.  Physical Exam Constitutional:      Appearance: Normal appearance.  HENT:     Head: Normocephalic. No abrasion, masses or laceration. Hair is normal.     Right Ear: External ear normal.     Left Ear: External ear normal.      Nose: Nose normal.     Mouth/Throat:     Lips: Pink.     Mouth: Mucous membranes are moist. No oral lesions.     Dentition: No dental caries.     Pharynx: No pharyngeal swelling, oropharyngeal exudate, posterior oropharyngeal erythema or uvula swelling.     Tonsils: No tonsillar exudate or tonsillar abscesses.  Eyes:     General: Lids are normal.        Right eye: No discharge.        Left eye: No discharge.     Conjunctiva/sclera: Conjunctivae normal.     Right eye: No exudate.    Left eye: No exudate. Abdominal:     General: Abdomen is flat.     Palpations: Abdomen is soft.     Tenderness: There is no abdominal tenderness. There is no rebound.  Genitourinary:    Pubic Area: No rash or pubic lice.      Penis: Normal and circumcised. No erythema or discharge.      Testes: Normal.        Right: Mass or tenderness not present.        Left: Mass or tenderness not present.     Rectum: Normal.     Comments: Discharge amount: None Color: None Musculoskeletal:     Cervical back: Full passive range of motion without pain, normal range of motion and neck supple.  Lymphadenopathy:     Cervical: No cervical adenopathy.  Right cervical: No superficial, deep or posterior cervical adenopathy.    Left cervical: No superficial, deep or posterior cervical adenopathy.     Upper Body:     Right upper body: No supraclavicular, axillary or epitrochlear adenopathy.     Left upper body: No supraclavicular, axillary or epitrochlear adenopathy.     Lower Body: Right inguinal adenopathy present. Left inguinal adenopathy present.  Skin:    General: Skin is warm and dry.     Findings: No lesion or rash.  Neurological:     Mental Status: He is alert and oriented to person, place, and time.  Psychiatric:        Attention and Perception: Attention normal.        Mood and Affect: Mood normal.        Speech: Speech normal.        Behavior: Behavior normal. Behavior is cooperative.        Assessment and Plan:  Steven Schwartz is a 26 y.o. male presenting to the [facility] Department for STI screening  1. Screening examination for venereal disease -26  year old male in clinic today for STD screening.   -Review of medication and allergies. -Patient does not have STI symptoms Patient accepted all screenings including oral GC, urine CT/GC and declines bloodwork for HIV/RPR.  Patient meets criteria for HepB screening? Yes. Ordered? No - low risk Patient meets criteria for HepC screening? Yes. Ordered? No - low risk Recommended condom use with all sex Discussed importance of condom use for STI prevent   Discussed time line for [facility] results and that patient will be called with positive results and encouraged patient to call if he had not heard in 2 weeks Recommended returning for continued or worsening symptoms.    - Chlamydia/GC NAA, Confirmation - Gonococcus culture  Total time spent: 30 minutes    Return if symptoms worsen or fail to improve.    Gregary Cromer, FNP

## 2022-06-01 LAB — CHLAMYDIA/GC NAA, CONFIRMATION
Chlamydia trachomatis, NAA: NEGATIVE
Neisseria gonorrhoeae, NAA: NEGATIVE

## 2022-06-02 LAB — GONOCOCCUS CULTURE

## 2022-08-10 ENCOUNTER — Ambulatory Visit: Payer: BC Managed Care – PPO

## 2023-08-08 ENCOUNTER — Ambulatory Visit: Payer: BC Managed Care – PPO | Admitting: Family Medicine
